# Patient Record
Sex: Female | Born: 1995 | Race: White | Hispanic: No | Smoking: Former smoker
Health system: Southern US, Community
[De-identification: ages and names within clinical notes are randomized; demographics above are authoritative.]

## PROBLEM LIST (undated history)

## (undated) DIAGNOSIS — E039 Hypothyroidism, unspecified: Secondary | ICD-10-CM

## (undated) DIAGNOSIS — F319 Bipolar disorder, unspecified: Secondary | ICD-10-CM

## (undated) DIAGNOSIS — F32A Depression, unspecified: Secondary | ICD-10-CM

## (undated) DIAGNOSIS — F419 Anxiety disorder, unspecified: Secondary | ICD-10-CM

## (undated) HISTORY — DX: Anxiety disorder, unspecified: F41.9

## (undated) HISTORY — DX: Hypothyroidism, unspecified: E03.9

## (undated) HISTORY — DX: Bipolar disorder, unspecified: F31.9

## (undated) HISTORY — PX: INDUCED ABORTION: SHX677

## (undated) HISTORY — DX: Depression, unspecified: F32.A

---

## 2016-08-27 ENCOUNTER — Emergency Department (HOSPITAL_COMMUNITY)
Admission: EM | Admit: 2016-08-27 | Discharge: 2016-08-28 | Disposition: A | Discharge status: Home / Self Care | Payer: BLUE CROSS/BLUE SHIELD | Attending: Emergency Medicine | Admitting: Emergency Medicine

## 2016-08-27 ENCOUNTER — Encounter (HOSPITAL_COMMUNITY): Payer: Self-pay

## 2016-08-27 DIAGNOSIS — Z3A01 Less than 8 weeks gestation of pregnancy: Secondary | ICD-10-CM | POA: Diagnosis not present

## 2016-08-27 DIAGNOSIS — O0281 Inappropriate change in quantitative human chorionic gonadotropin (hCG) in early pregnancy: Secondary | ICD-10-CM | POA: Diagnosis not present

## 2016-08-27 DIAGNOSIS — Z87891 Personal history of nicotine dependence: Secondary | ICD-10-CM | POA: Diagnosis not present

## 2016-08-27 DIAGNOSIS — O210 Mild hyperemesis gravidarum: Secondary | ICD-10-CM | POA: Diagnosis not present

## 2016-08-27 DIAGNOSIS — O219 Vomiting of pregnancy, unspecified: Secondary | ICD-10-CM

## 2016-08-27 LAB — CBC
HCT: 38.2 % (ref 36.0–46.0)
HEMOGLOBIN: 12.3 g/dL (ref 12.0–15.0)
MCH: 26.5 pg (ref 26.0–34.0)
MCHC: 32.2 g/dL (ref 30.0–36.0)
MCV: 82.2 fL (ref 78.0–100.0)
PLATELETS: 347 10*3/uL (ref 150–400)
RBC: 4.65 MIL/uL (ref 3.87–5.11)
RDW: 15.5 % (ref 11.5–15.5)
WBC: 13.7 10*3/uL — ABNORMAL HIGH (ref 4.0–10.5)

## 2016-08-27 LAB — BASIC METABOLIC PANEL
ANION GAP: 9 (ref 5–15)
BUN: 5 mg/dL — ABNORMAL LOW (ref 6–20)
CHLORIDE: 102 mmol/L (ref 101–111)
CO2: 23 mmol/L (ref 22–32)
Calcium: 9.2 mg/dL (ref 8.9–10.3)
Creatinine, Ser: 0.65 mg/dL (ref 0.44–1.00)
GFR calc non Af Amer: >60 mL/min (ref >60)
Glucose, Bld: 89 mg/dL (ref 65–99)
Potassium: 3.6 mmol/L (ref 3.5–5.1)
Sodium: 134 mmol/L — ABNORMAL LOW (ref 135–145)

## 2016-08-27 LAB — HCG, QUANTITATIVE, PREGNANCY: hCG, Beta Chain, Quant, S: 147446 m[IU]/mL — ABNORMAL HIGH (ref <5)

## 2016-08-27 MED ORDER — SODIUM CHLORIDE 0.9 % IV SOLN
INTRAVENOUS | Status: AC
  Administered 2016-08-27: 23:00:00 via INTRAVENOUS

## 2016-08-27 MED ORDER — METOCLOPRAMIDE HCL 5 MG/ML IJ SOLN
10.0000 mg | Freq: Once | INTRAMUSCULAR | Status: AC
  Administered 2016-08-27: 10 mg via INTRAVENOUS
  Filled 2016-08-27: qty 2

## 2016-08-27 NOTE — ED Triage Notes (Signed)
Pt complaining of nausea and vomiting. Pt states recently found out she is pregnant. Pt states LMP end of December. Pt states emesis x 6 today.

## 2016-08-27 NOTE — ED Provider Notes (Signed)
[facility]-EMERGENCY DEPT Provider Note   CSN: [id number] Arrival date & time: 08/27/16  1857   By signing my name below, I, Stacie Schmidt, attest that this documentation has been prepared under the direction and in the presence of [facility], FNP. Electronically Signed: Clarisse Schmidt, Scribe. 08/27/16. 10:15 PM.   History   Chief Complaint Chief Complaint  Patient presents with  . Emesis During Pregnancy   The history is provided by the patient and medical records. No language interpreter was used.    HPI Comments: Stacie Schmidt is a 21 y.o. female who presents to the Emergency Department complaining of profuse nausea and vomiting today. She states she found out she was pregnant [redacted] weeks ago via a home pregnancy test. Pt notes she has vomited 6 times today and she reports associated headache and abdominal cramping during dry heaving spells. She notes she is unable to keep down fluids or food. G2/P1/A0. She states her last pregnancy involved no complications and her child was delivered in [place] of [address]. She reports she plans to return to [place] of [place] for prenatal care during this pregnancy. Last period reportedly in the last week of December. Pt denies fever, chills, cough, congestion, urinary frequency or dysuria.  History reviewed. No pertinent past medical history.  There are no active problems to display for this patient.   History reviewed. No pertinent surgical history.  OB History    Gravida Para Term Preterm AB Living   1             SAB TAB Ectopic Multiple Live Births                   Home Medications    Prior to Admission medications   Medication Sig Start Date End Date Taking? Authorizing Provider  metoCLOPramide (REGLAN) 10 MG tablet Take 1 tablet (10 mg total) by mouth every 8 (eight) hours as needed for nausea. 08/28/16   Hope Orlene Och, NP    Family History History reviewed. No pertinent family history.  Social  History Social History  Substance Use Topics  . Smoking status: Former Games developer  . Smokeless tobacco: Never Used  . Alcohol use Yes     Allergies   Patient has no allergy information on record.   Review of Systems Review of Systems  Constitutional: Negative for chills and fever.  HENT: Negative for congestion.   Respiratory: Negative for cough.   Gastrointestinal: Positive for nausea and vomiting. Negative for diarrhea.  Genitourinary: Negative for dysuria, frequency and urgency.  All other systems reviewed and are negative.    Physical Exam Updated Vital Signs BP 120/68 (BP Location: Right Arm)   Pulse 76   Temp 98.4 F (36.9 C) (Oral)   Resp 20   Ht 5' (1.524 m)   Wt 150 lb (68 kg)   LMP 07/19/2016 (Approximate)   SpO2 100%   BMI 29.29 kg/m   Physical Exam  Constitutional: She is oriented to person, place, and time. Vital signs are normal. She appears well-developed and well-nourished.  Non-toxic appearance. No distress.  HENT:  Head: Normocephalic and atraumatic.  Mouth/Throat: Oropharynx is clear and moist and mucous membranes are normal.  Eyes: EOM are normal.  Neck: Normal range of motion. Neck supple.  Cardiovascular: Normal rate and regular rhythm.   Pulmonary/Chest: Effort normal and breath sounds normal.  Abdominal: Soft. Normal appearance and bowel sounds are normal. There is no tenderness. There is no CVA tenderness.  Musculoskeletal: Normal range of motion.  Neurological: She is alert and oriented to person, place, and time. She has normal strength. No sensory deficit.  Skin: Skin is warm, dry and intact. No rash noted.  Psychiatric: She has a normal mood and affect. Her behavior is normal.  Nursing note and vitals reviewed.    ED Treatments / Results  DIAGNOSTIC STUDIES: Oxygen Saturation is 100% on RA, normal by my interpretation.    COORDINATION OF CARE: 10:08 PM Discussed treatment plan with pt at bedside and pt agreed to plan. Will order IV  fluids and urinalysis. Will order nausea medications.  Labs (all labs ordered are listed, but only abnormal results are displayed) Labs Reviewed  URINALYSIS, ROUTINE W REFLEX MICROSCOPIC - Abnormal; Notable for the following:       Result Value   Color, Urine STRAW (*)    Ketones, ur 20 (*)    All other components within normal limits  CBC - Abnormal; Notable for the following:    WBC 13.7 (*)    All other components within normal limits  HCG, QUANTITATIVE, PREGNANCY - Abnormal; Notable for the following:    hCG, Beta Chain, Quant, S [id number] (*)    All other components within normal limits  BASIC METABOLIC PANEL - Abnormal; Notable for the following:    Sodium 134 (*)    BUN 5 (*)    All other components within normal limits     Radiology [place] Ob Comp Less 14 Wks  Result Date: 08/28/2016 CLINICAL DATA:  Hyperemesis. Estimated gestational age by LMP is 7 weeks 0 days. Quantitative beta HCG is 147,446 EXAM: OBSTETRIC <14 WK US AND TRANSVAGINAL OB US TECHNIQUE: Both transabdominal and transvaginal ultrasound examinations were performed for complete evaluation of the gestation as well as the maternal uterus, adnexal regions, and pelvic cul-de-sac. Transvaginal technique was performed to assess early pregnancy. COMPARISON:  None. FINDINGS: Intrauterine gestational sac: A single intrauterine gestational sac is present. Yolk sac:  Yolk sac is identified. Embryo:  Fetal pole is present. Cardiac Activity: Fetal cardiac activity is observed. Heart Rate: 119  bpm CRL:  8.7  mm   6 w   6 d                  US EDC: 04/17/2017 Subchorionic hemorrhage:  None visualized. Maternal uterus/adnexae: Uterus is anteverted. No myometrial mass lesions identified. Both ovaries are visualized and appear normal. No abnormal adnexal masses. No free fluid in the pelvis. IMPRESSION: Single intrauterine pregnancy. Estimated gestational age by LMP is 6 weeks 6 days. No acute complication is identified sonographically.  Electronically Signed   By: Burman NievesWilliam  Stevens M.D.   On: 08/28/2016 01:53   [place] Ob Transvaginal  Result Date: 08/28/2016 CLINICAL DATA:  Hyperemesis. Estimated gestational age by LMP is 7 weeks 0 days. Quantitative beta HCG is 147,446 EXAM: OBSTETRIC <14 WK US AND TRANSVAGINAL OB US TECHNIQUE: Both transabdominal and transvaginal ultrasound examinations were performed for complete evaluation of the gestation as well as the maternal uterus, adnexal regions, and pelvic cul-de-sac. Transvaginal technique was performed to assess early pregnancy. COMPARISON:  None. FINDINGS: Intrauterine gestational sac: A single intrauterine gestational sac is present. Yolk sac:  Yolk sac is identified. Embryo:  Fetal pole is present. Cardiac Activity: Fetal cardiac activity is observed. Heart Rate: 119  bpm CRL:  8.7  mm   6 w   6 d  [place] EDC: 04/17/2017 Subchorionic hemorrhage:  None visualized. Maternal uterus/adnexae: Uterus is anteverted. No myometrial mass lesions identified. Both ovaries are visualized and appear normal. No abnormal adnexal masses. No free fluid in the pelvis. IMPRESSION: Single intrauterine pregnancy. Estimated gestational age by LMP is 6 weeks 6 days. No acute complication is identified sonographically. Electronically Signed   By: Burman Nieves M.D.   On: 08/28/2016 01:53    Procedures Procedures (including critical care time)  Medications Ordered in ED Medications  0.9 %  sodium chloride infusion ( Intravenous Stopped 08/28/16 0023)  metoCLOPramide (REGLAN) injection 10 mg (10 mg Intravenous Given 08/27/16 2256)     Initial Impression / Assessment and Plan / ED Course  I have reviewed the triage vital signs and the nursing notes.  Pertinent labs & imaging results that were available during my care of the patient were reviewed by me and considered in my medical decision making (see chart for details).    I personally performed the services described in this documentation, which  was scribed in my presence. The recorded information has been reviewed and is accurate.   Final Clinical Impressions(s) / ED Diagnoses  21 y.o. female with n/v in early pregnancy stable for d/c feeling much better after IV hydration and Reglan. Taking PO fluids without difficulty. Ultrasound she viable IUP. Will Rx Reglan and she will f/u with her OB to start prenatal care. Return precautions given.  Final diagnoses:  Nausea and vomiting during pregnancy prior to [redacted] weeks gestation    New Prescriptions New Prescriptions   METOCLOPRAMIDE (REGLAN) 10 MG TABLET    Take 1 tablet (10 mg total) by mouth every 8 (eight) hours as needed for nausea.     [address] [place], NP 08/28/16 0210    Shaune Pollack, MD 08/28/16 [id number]

## 2016-08-28 ENCOUNTER — Emergency Department (HOSPITAL_COMMUNITY): Payer: BLUE CROSS/BLUE SHIELD

## 2016-08-28 LAB — URINALYSIS, ROUTINE W REFLEX MICROSCOPIC
Bilirubin Urine: NEGATIVE
Glucose, UA: NEGATIVE mg/dL
Hgb urine dipstick: NEGATIVE
Ketones, ur: 20 mg/dL — AB
LEUKOCYTES UA: NEGATIVE
NITRITE: NEGATIVE
Protein, ur: NEGATIVE mg/dL
SPECIFIC GRAVITY, URINE: 1.005 (ref 1.005–1.030)
pH: 6 (ref 5.0–8.0)

## 2016-08-28 MED ORDER — METOCLOPRAMIDE HCL 10 MG PO TABS: 10.0000 mg | ORAL_TABLET | Freq: Three times a day (TID) | ORAL | 0 refills | Status: DC | PRN

## 2016-08-28 NOTE — ED Notes (Signed)
Patient transported to Ultrasound 

## 2016-08-28 NOTE — Discharge Instructions (Signed)
Your ultrasound tonight shows that you are 6 weeks and 6 days pregnant. Start your prenatal care and take the medication for nausea as needed.

## 2016-08-28 NOTE — ED Notes (Signed)
Pt returned from ultrasound

## 2017-03-04 IMAGING — US US OB TRANSVAGINAL
1 series · 14 of 28 positions shown · non-contrast
Comparison: None.

CLINICAL DATA: Hyperemesis. Estimated gestational age by LMP is 7
weeks 0 days. Quantitative beta HCG is 147,446

EXAM:
OBSTETRIC <14 WK US AND TRANSVAGINAL OB US
TECHNIQUE: Both transabdominal and transvaginal ultrasound examinations were
performed for complete evaluation of the gestation as well as the
maternal uterus, adnexal regions, and pelvic cul-de-sac.
Transvaginal technique was performed to assess early pregnancy.

[Series 1: us ob transvaginal · 0.20mm/px · 46 acquisitions, 14 frames shown]
[im 2/46]
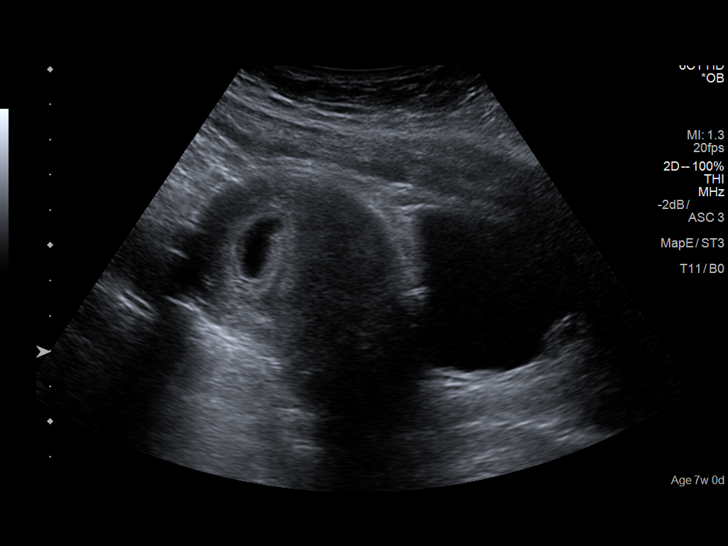
[im 6/46]
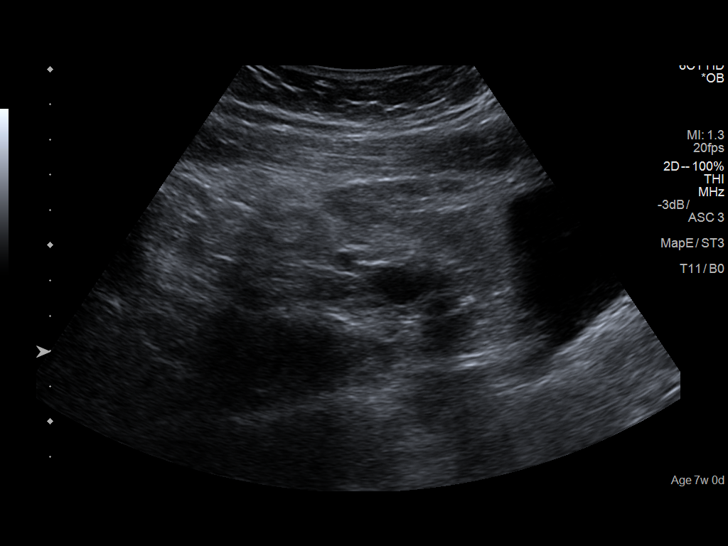
[im 9/46]
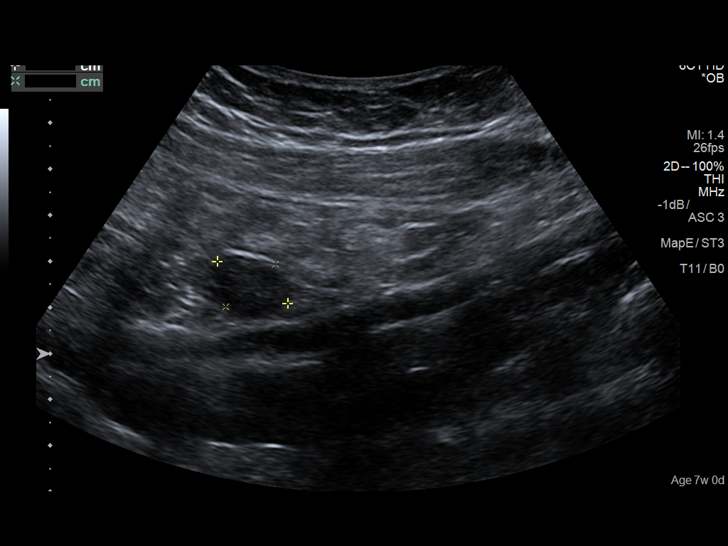
[im 12/46]
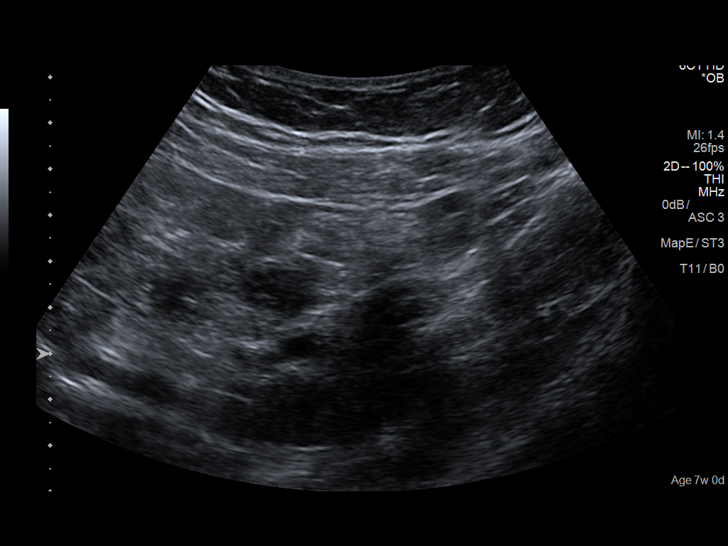
[im 16/46]
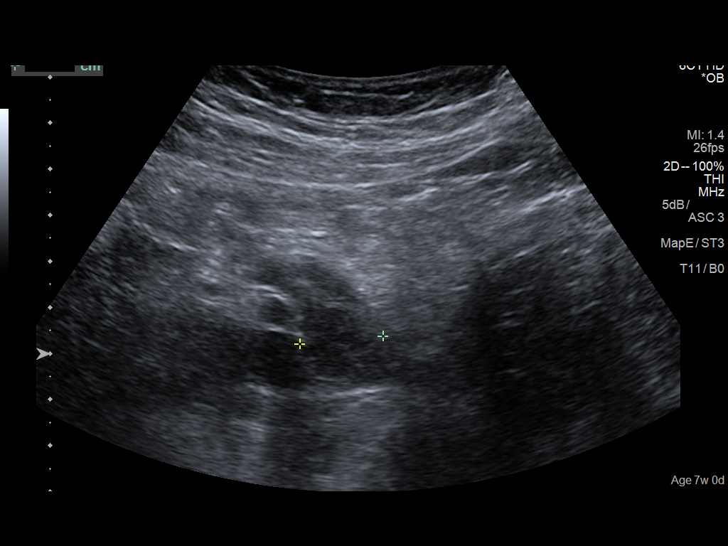
[im 19/46]
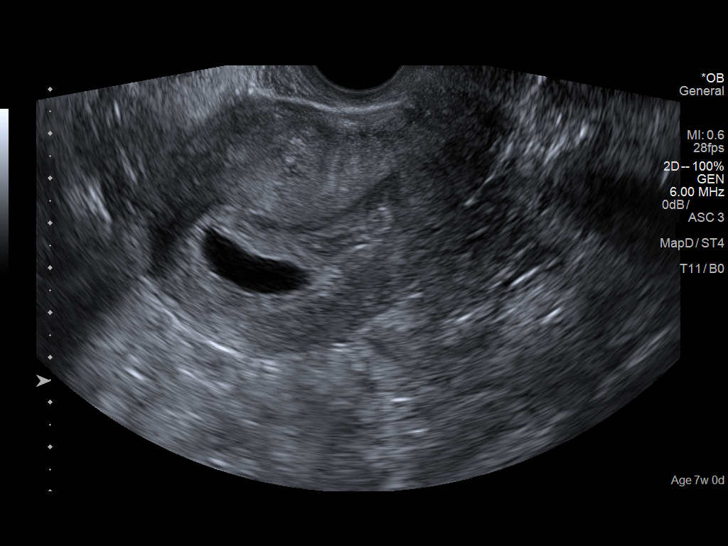
[im 22/46]
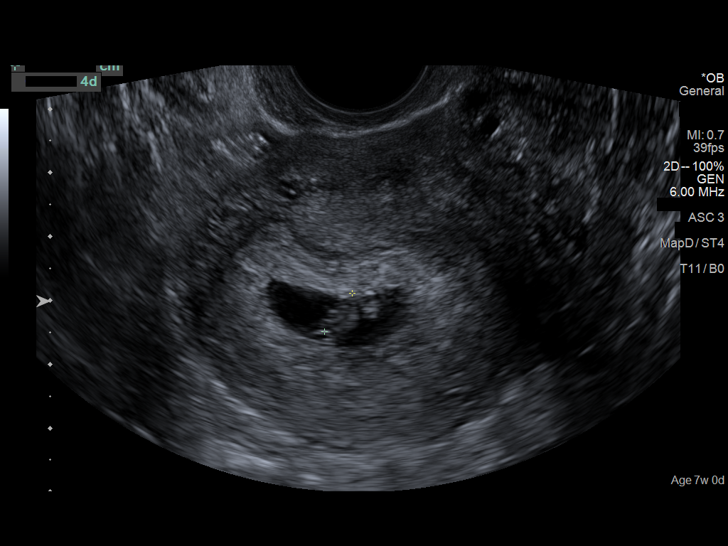
[im 26/46]
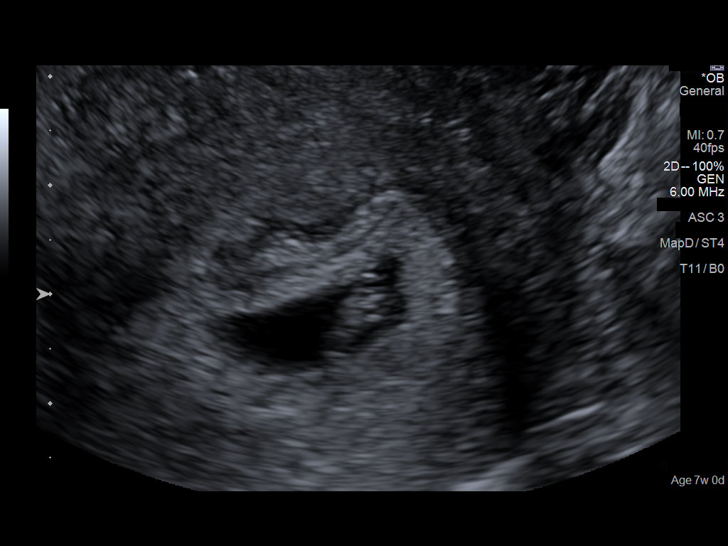
[im 29/46]
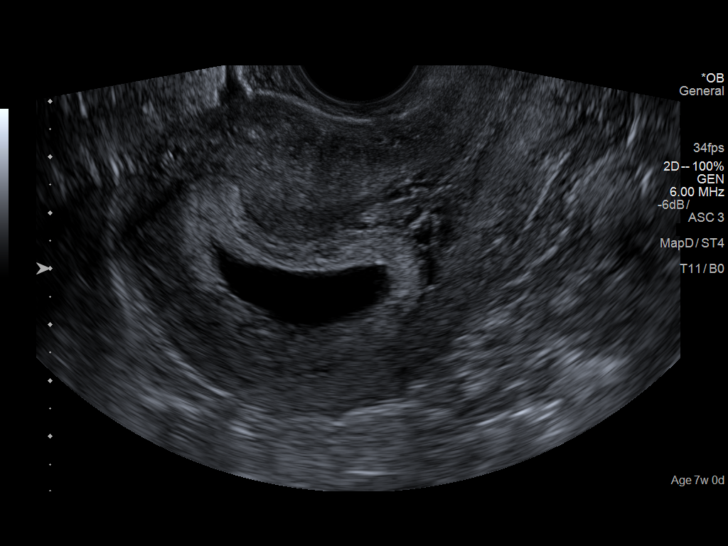
[im 32/46]
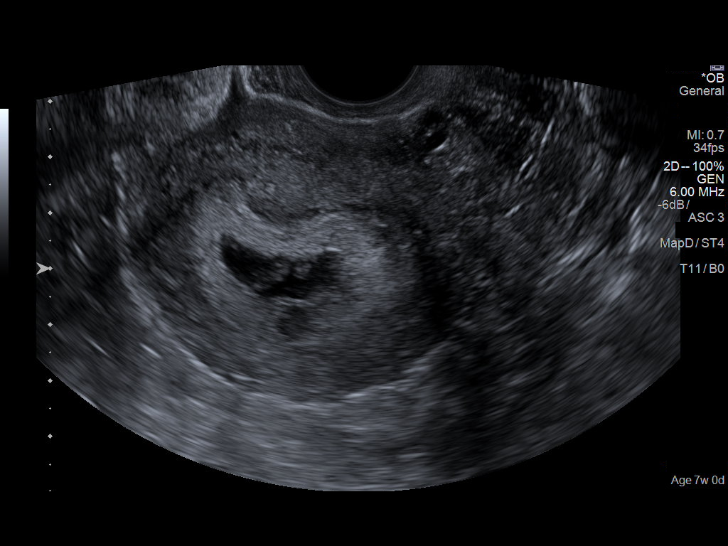
[im 36/46]
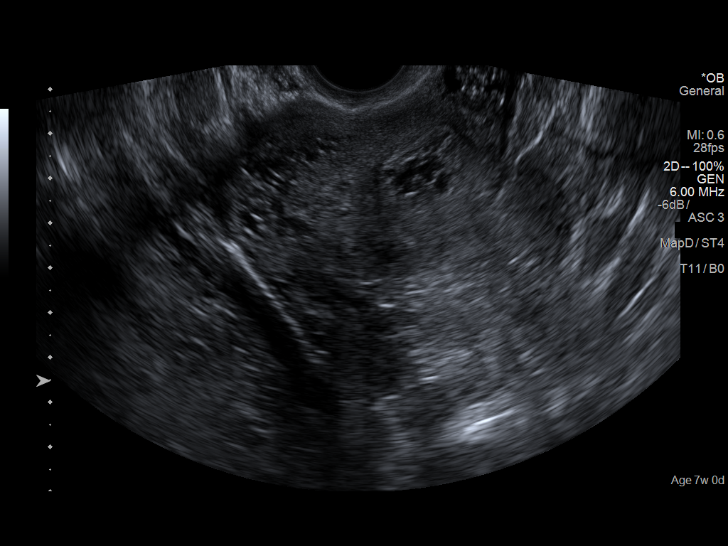
[im 39/46]
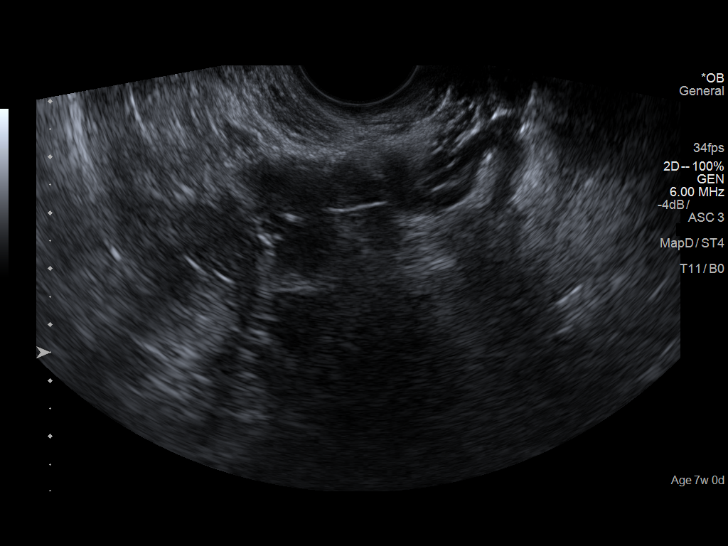
[im 42/46]
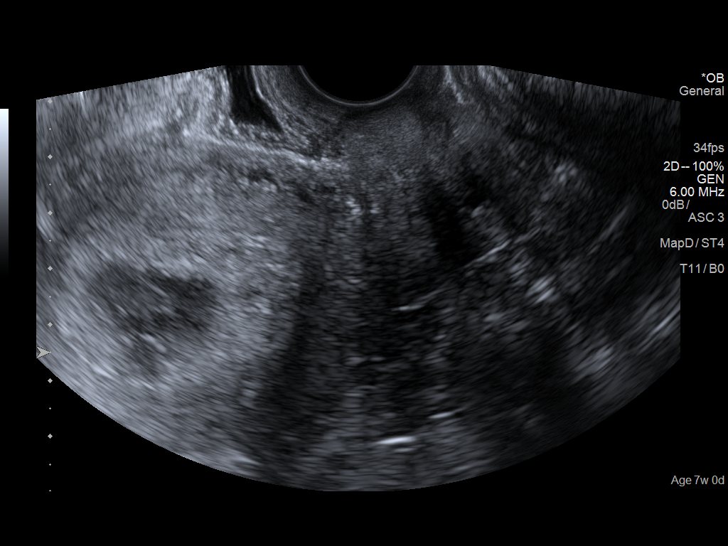
[im 46/46]
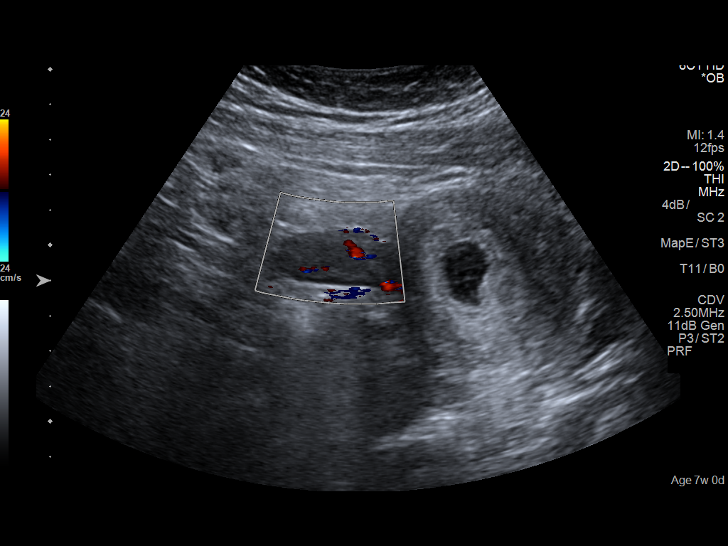

[14 of 28 positions shown; findings below may reference images not displayed]

FINDINGS: Intrauterine gestational sac: A single intrauterine gestational sac
is present.

Yolk sac:  Yolk sac is identified.

Embryo:  Fetal pole is present.

Cardiac Activity: Fetal cardiac activity is observed.

Heart Rate: 119  bpm

CRL:  8.7  mm   6 w   6 d                  US EDC: 04/17/2017

Subchorionic hemorrhage:  None visualized.

Maternal uterus/adnexae: Uterus is anteverted. No myometrial mass
lesions identified. Both ovaries are visualized and appear normal.
No abnormal adnexal masses. No free fluid in the pelvis.
IMPRESSION: Single intrauterine pregnancy. Estimated gestational age by LMP is 6
weeks 6 days. No acute complication is identified sonographically.

## 2018-01-04 ENCOUNTER — Telehealth: Payer: Self-pay | Admitting: Obstetrics and Gynecology

## 2018-01-04 NOTE — Telephone Encounter (Signed)
Called and left a message for patient to call back to schedule a new patient doctor referral appointment with our office to see Dr. Jertson. °

## 2022-04-18 NOTE — Progress Notes (Unsigned)
[facility] Initial Visit:  Patient Care Team: Patient, No Pcp Per as PCP - General (General Practice)  CHIEF COMPLAINTS/PURPOSE OF CONSULTATION:  Oncology History   No history exists.    HISTORY OF PRESENTING ILLNESS: Stacie Schmidt 26 y.o. 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  Patient has chronic fatigue and snores a lot in her sleep  Dec 12, 2021: WBC 10.0 hemoglobin 11.7 platelet count 401; 46 segs 44 lymphs 7 monos 2 eos 1 basophil  April 08, 2022 WBC 13.6 hemoglobin 11.5 MCV 79 platelet count 464; 47 segs 43 lymphs 8 monos 1 EO 1 basophil.  Absolute neutrophil count 6.4 absolute lymphocyte count 5.9.  Reticulocyte count 0.83% Vitamin B12 756 folate 8.1 ferritin 2.5 CMP normal  hemoglobin A1c 5.5 TSH 4.49  April 20 2022:  [facility] Hematology Consult  Feels well. Had a bout of laryngitis a few months ago treated with Abx.  No recurrent sinopulmonary infections.    Social:  Educational psychologist.  Divorced.  Vapes daily which she began after quitting smoking 2 yrs ago.  Began smoking cigarettes age 20 and quit at 2 and smoked 2 ppd.  EtOH once every 2 months  [facility] Mother alive 42 HTN and thyroid Father died 41 suicide 70 brother alive 8 well   Review of Systems  Constitutional:  Negative for appetite change, chills, fatigue, fever and unexpected weight change.  HENT:   Negative for mouth sores, nosebleeds, sore throat, trouble swallowing and voice change.   Eyes:  Negative for eye problems and icterus.       Vision changes:  None  Respiratory:  Negative for chest tightness, cough, hemoptysis, shortness of breath and wheezing.        PND:  none Orthopnea:  none DOE:    Cardiovascular:  Negative for chest pain, leg swelling and palpitations.       PND:  none Orthopnea:  none  Gastrointestinal:  Negative for abdominal pain, blood in stool,  constipation, diarrhea and nausea.  Endocrine:       Cold intolerance:  none Heat intolerance:  none  Genitourinary:  Negative for bladder incontinence, difficulty urinating, dysuria, frequency, hematuria and nocturia.   Musculoskeletal:  Negative for arthralgias, back pain, gait problem, myalgias, neck pain and neck stiffness.  Skin:  Negative for itching, rash and wound.  Neurological:  Positive for headaches. Negative for dizziness, extremity weakness, gait problem, light-headedness, numbness, seizures and speech difficulty.       Has HA's quite often  Hematological:  Negative for adenopathy. Bruises/bleeds easily.    MEDICAL HISTORY: Past Medical History:  Diagnosis Date   Anxiety    Bipolar disorder ([place])    Depression    Hypothyroidism     SURGICAL HISTORY: Past Surgical History:  Procedure Laterality Date   INDUCED ABORTION      SOCIAL HISTORY: Social History   Socioeconomic History   Marital status: Married    Spouse name: Not on file   Number of children: Not on file   Years of education: Not on file   Highest education level: Not on file  Occupational History   Not on file  Tobacco Use   Smoking status: Former   Smokeless tobacco: Never  Vaping Use   Vaping Use: Every day  Substance and Sexual Activity   Alcohol use: Yes   Drug use: Yes    Types: Marijuana   Sexual activity: Not on  file  Other Topics Concern   Not on file  Social History Narrative   Not on file   Social Determinants of Health   Financial Resource Strain: Not on file  Food Insecurity: Not on file  Transportation Needs: Not on file  Physical Activity: Not on file  Stress: Not on file  Social Connections: Not on file  Intimate Partner Violence: Not on file    FAMILY HISTORY Family History  Problem Relation Age of Onset   Thyroid disease Mother    Hypertension Mother     ALLERGIES:  has no allergies on file.  MEDICATIONS:  Current Outpatient Medications  Medication Sig  Dispense Refill   buPROPion (ZYBAN) 150 MG 12 hr tablet Take 150 mg by mouth in the morning.     busPIRone (BUSPAR) 10 MG tablet Take by mouth.     escitalopram (LEXAPRO) 5 MG tablet Take 1 tablet by mouth daily.     levothyroxine (SYNTHROID) 112 MCG tablet TAKE 1 TABLET BY MOUTH ONCE DAILY IN THE MORNING BEFORE BREAKFAST     lurasidone (LATUDA) 40 MG TABS tablet TAKE 1 TABLET BY MOUTH ONCE DAILY WITH MINIMUM 350KCAL FOOD     paragard intrauterine copper IUD IUD by Intrauterine route.     No current facility-administered medications for this visit.    PHYSICAL EXAMINATION:  ECOG PERFORMANCE STATUS: {CHL ONC ECOG PS:[phone]}   There were no vitals filed for this visit.  There were no vitals filed for this visit.   Physical Exam   LABORATORY DATA: I have personally reviewed the data as listed:  No visits with results within 1 Month(s) from this visit.  Latest known visit with results is:  Admission on 08/27/2016, Discharged on 08/28/2016  Component Date Value Ref Range Status   Color, Urine 08/28/2016 STRAW (A)  YELLOW Final   APPearance 08/28/2016 CLEAR  CLEAR Final   Specific Gravity, Urine 08/28/2016 1.005  1.005 - 1.030 Final   pH 08/28/2016 6.0  5.0 - 8.0 Final   Glucose, UA 08/28/2016 NEGATIVE  NEGATIVE mg/dL Final   Hgb urine dipstick 08/28/2016 NEGATIVE  NEGATIVE Final   Bilirubin Urine 08/28/2016 NEGATIVE  NEGATIVE Final   Ketones, ur 08/28/2016 20 (A)  NEGATIVE mg/dL Final   Protein, ur 08/28/2016 NEGATIVE  NEGATIVE mg/dL Final   Nitrite 08/28/2016 NEGATIVE  NEGATIVE Final   Leukocytes, UA 08/28/2016 NEGATIVE  NEGATIVE Final   WBC 08/27/2016 13.7 (H)  4.0 - 10.5 K/uL Final   RBC 08/27/2016 4.65  3.87 - 5.11 MIL/uL Final   Hemoglobin 08/27/2016 12.3  12.0 - 15.0 g/dL Final   HCT 08/27/2016 38.2  36.0 - 46.0 % Final   MCV 08/27/2016 82.2  78.0 - 100.0 fL Final   MCH 08/27/2016 26.5  26.0 - 34.0 pg Final   MCHC 08/27/2016 32.2  30.0 - 36.0 g/dL Final   RDW  08/27/2016 15.5  11.5 - 15.5 % Final   Platelets 08/27/2016 347  150 - 400 K/uL Final   hCG, Beta Chain, Quant, S 08/27/2016 147,446 (H)  <5 mIU/mL Final   Comment:          GEST. AGE      CONC.  (mIU/mL)   <=1 WEEK        5 - 50     2 WEEKS       50 - 500     3 WEEKS       100 - 10,000     4 WEEKS  1,000 - 30,000     5 WEEKS     3,500 - 115,000   6-8 WEEKS     12,000 - 270,000    12 WEEKS     15,000 - 220,000        FEMALE AND NON-PREGNANT FEMALE:     LESS THAN 5 mIU/mL    Sodium 08/27/2016 134 (L)  135 - 145 mmol/L Final   Potassium 08/27/2016 3.6  3.5 - 5.1 mmol/L Final   Chloride 08/27/2016 102  101 - 111 mmol/L Final   CO2 08/27/2016 23  22 - 32 mmol/L Final   Glucose, Bld 08/27/2016 89  65 - 99 mg/dL Final   BUN 08/27/2016 5 (L)  6 - 20 mg/dL Final   Creatinine, Ser 08/27/2016 0.65  0.44 - 1.00 mg/dL Final   Calcium 08/27/2016 9.2  8.9 - 10.3 mg/dL Final   GFR calc non Af Amer 08/27/2016 >60  >60 mL/min Final   GFR calc Af Amer 08/27/2016 >60  >60 mL/min Final   Comment: (NOTE) The eGFR has been calculated using the CKD EPI equation. This calculation has not been validated in all clinical situations. eGFR's persistently <60 mL/min signify possible Chronic Kidney Disease.    Anion gap 08/27/2016 9  5 - 15 Final    RADIOGRAPHIC STUDIES: I have personally reviewed the radiological images as listed and agree with the findings in the report  No results found.  ASSESSMENT/PLAN Cancer Staging  No matching staging information was found for the patient.   No problem-specific Assessment & Plan notes found for this encounter.   No orders of the defined types were placed in this encounter.   All questions were answered. The patient knows to call the clinic with any problems, questions or concerns.  This note was electronically signed.    Barbee Cough, MD  04/18/2022 3:31 PM

## 2022-04-20 ENCOUNTER — Encounter: Payer: Self-pay | Admitting: Oncology

## 2022-04-20 ENCOUNTER — Inpatient Hospital Stay: Payer: Medicaid Other

## 2022-04-20 ENCOUNTER — Inpatient Hospital Stay: Payer: Medicaid Other | Attending: Oncology | Admitting: Oncology

## 2022-04-20 DIAGNOSIS — E039 Hypothyroidism, unspecified: Secondary | ICD-10-CM | POA: Insufficient documentation

## 2022-04-20 DIAGNOSIS — R5382 Chronic fatigue, unspecified: Secondary | ICD-10-CM | POA: Diagnosis not present

## 2022-04-20 DIAGNOSIS — D72828 Other elevated white blood cell count: Secondary | ICD-10-CM | POA: Diagnosis not present

## 2022-04-20 DIAGNOSIS — U070 Vaping-related disorder: Secondary | ICD-10-CM | POA: Diagnosis not present

## 2022-04-20 DIAGNOSIS — R0683 Snoring: Secondary | ICD-10-CM | POA: Diagnosis not present

## 2022-04-20 DIAGNOSIS — D72829 Elevated white blood cell count, unspecified: Secondary | ICD-10-CM | POA: Diagnosis present

## 2022-04-20 LAB — CBC WITH DIFFERENTIAL (CANCER CENTER ONLY)
Abs Immature Granulocytes: 0.04 10*3/uL (ref 0.00–0.07)
Basophils Absolute: 0.1 10*3/uL (ref 0.0–0.1)
Basophils Relative: 1 %
Eosinophils Absolute: 0.2 10*3/uL (ref 0.0–0.5)
Eosinophils Relative: 2 %
HCT: 36.9 % (ref 36.0–46.0)
Hemoglobin: 11.3 g/dL — ABNORMAL LOW (ref 12.0–15.0)
Immature Granulocytes: 0 %
Lymphocytes Relative: 39 %
Lymphs Abs: 4.7 10*3/uL — ABNORMAL HIGH (ref 0.7–4.0)
MCH: 25.9 pg — ABNORMAL LOW (ref 26.0–34.0)
MCHC: 30.6 g/dL (ref 30.0–36.0)
MCV: 84.4 fL (ref 80.0–100.0)
Monocytes Absolute: 0.7 10*3/uL (ref 0.1–1.0)
Monocytes Relative: 6 %
Neutro Abs: 6.3 10*3/uL (ref 1.7–7.7)
Neutrophils Relative %: 52 %
Platelet Count: 430 10*3/uL — ABNORMAL HIGH (ref 150–400)
RBC: 4.37 MIL/uL (ref 3.87–5.11)
RDW: 18 % — ABNORMAL HIGH (ref 11.5–15.5)
WBC Count: 12 10*3/uL — ABNORMAL HIGH (ref 4.0–10.5)
nRBC: 0 % (ref 0.0–0.2)

## 2022-04-21 ENCOUNTER — Other Ambulatory Visit: Payer: Self-pay

## 2022-04-21 DIAGNOSIS — D72828 Other elevated white blood cell count: Secondary | ICD-10-CM

## 2022-04-21 DIAGNOSIS — D72829 Elevated white blood cell count, unspecified: Secondary | ICD-10-CM | POA: Diagnosis not present

## 2022-04-24 LAB — MISC LABCORP TEST (SEND OUT)
LabCorp test name: 511520
Labcorp test code: 511520

## 2022-04-30 LAB — MISC LABCORP TEST (SEND OUT): Labcorp test code: 489615

## 2022-05-15 NOTE — Progress Notes (Unsigned)
[facility] Cancer Follow up Visit:  Patient Care Team: Patient, No Pcp Per as PCP - General (General Practice)  CHIEF COMPLAINTS/PURPOSE OF CONSULTATION:  Oncology History   No history exists.    HISTORY OF PRESENTING ILLNESS: Stacie Schmidt 26 y.o. 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  Patient has chronic fatigue and snores a lot in her sleep  Dec 12, 2021: WBC 10.0 hemoglobin 11.7 platelet count 401; 46 segs 44 lymphs 7 monos 2 eos 1 basophil  April 08, 2022 WBC 13.6 hemoglobin 11.5 MCV 79 platelet count 464; 47 segs 43 lymphs 8 monos 1 EO 1 basophil.  Absolute neutrophil count 6.4 absolute lymphocyte count 5.9.  Reticulocyte count 0.83% Vitamin B12 756 folate 8.1 ferritin 2.5 CMP normal  hemoglobin A1c 5.5 TSH 4.49  April 20 2022:  [facility] Hematology Consult  Feels well. Had a bout of laryngitis a few months ago treated with Abx.  No recurrent sinopulmonary infections.    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  Vapes daily which she began after quitting smoking 2 yrs ago.  Began smoking cigarettes age 4 and quit at 1 and smoked 2 ppd.  EtOH once every 2 months  [facility] Mother alive 45 HTN and thyroid Father died 46 suicide Half brother alive 50 well   WBC 12.0 hemoglobin 11.3 platelet count 430; 52 segs 39 lymphs 6 monos 2 eos 1 basophil JAK2 NGS panel negative FISH for BCR able normal  May 18 2022:  Scheduled follow up regarding granulocytosis Reviewed results of labs with patient.  She is still vaping but has cut down a bit At next visit discuss her use of oral iron   Review of Systems  Constitutional:  Negative for appetite change, chills, fatigue, fever and unexpected weight change.  HENT:   Negative for mouth sores, nosebleeds, sore throat, trouble swallowing and voice change.   Eyes:  Negative for eye problems and icterus.       Vision  changes:  None  Respiratory:  Negative for chest tightness, cough, hemoptysis, shortness of breath and wheezing.        PND:  none Orthopnea:  none DOE:    Cardiovascular:  Negative for chest pain, leg swelling and palpitations.       PND:  none Orthopnea:  none  Gastrointestinal:  Negative for abdominal pain, blood in stool, constipation, diarrhea and nausea.  Endocrine:       Cold intolerance:  none Heat intolerance:  none  Genitourinary:  Negative for bladder incontinence, difficulty urinating, dysuria, frequency, hematuria and nocturia.   Musculoskeletal:  Negative for arthralgias, back pain, gait problem, myalgias, neck pain and neck stiffness.  Skin:  Negative for itching, rash and wound.  Neurological:  Positive for headaches. Negative for dizziness, extremity weakness, gait problem, light-headedness, numbness, seizures and speech difficulty.       Has HA's quite often  Hematological:  Negative for adenopathy. Bruises/bleeds easily.    MEDICAL HISTORY: Past Medical History:  Diagnosis Date   Anxiety    Bipolar disorder (HCC)    Depression    Hypothyroidism     SURGICAL HISTORY: Past Surgical History:  Procedure Laterality Date   INDUCED ABORTION      SOCIAL HISTORY: Social History   Socioeconomic History   Marital status: Divorced    Spouse name: Not on file   Number of children: 2   Years of education: 9   Highest  education level: 9th grade  Occupational History   Not on file  Tobacco Use   Smoking status: Former   Smokeless tobacco: Never  Vaping Use   Vaping Use: Every day  Substance and Sexual Activity   Alcohol use: Yes   Drug use: Yes    Types: Marijuana   Sexual activity: Not on file  Other Topics Concern   Not on file  Social History Narrative   Not on file   Social Determinants of Health   Financial Resource Strain: Not on file  Food Insecurity: Not on file  Transportation Needs: Not on file  Physical Activity: Not on file  Stress:  Not on file  Social Connections: Not on file  Intimate Partner Violence: Not on file    FAMILY HISTORY Family History  Problem Relation Age of Onset   Thyroid disease Mother    Hypertension Mother    Breast cancer Other    Liver cancer Other     ALLERGIES:  has No Known Allergies.  MEDICATIONS:  Current Outpatient Medications  Medication Sig Dispense Refill   buPROPion (ZYBAN) 150 MG 12 hr tablet Take 150 mg by mouth in the morning.     busPIRone (BUSPAR) 10 MG tablet Take by mouth.     escitalopram (LEXAPRO) 5 MG tablet Take 1 tablet by mouth daily.     ferrous sulfate 325 (65 FE) MG tablet Take 325 mg by mouth 2 (two) times daily with a meal.     levothyroxine (SYNTHROID) 112 MCG tablet TAKE 1 TABLET BY MOUTH ONCE DAILY IN THE MORNING BEFORE BREAKFAST     lurasidone (LATUDA) 40 MG TABS tablet TAKE 1 TABLET BY MOUTH ONCE DAILY WITH MINIMUM 350KCAL FOOD     paragard intrauterine copper IUD IUD by Intrauterine route.     trimethoprim-polymyxin b (POLYTRIM) ophthalmic solution 1 drop 4 (four) times daily.     No current facility-administered medications for this visit.    PHYSICAL EXAMINATION:  ECOG PERFORMANCE STATUS: 0 - Asymptomatic   Vitals:   05/18/22 1500  BP: 104/71  Pulse: 81  Resp: 14  Temp: (!) 97.4 F (36.3 C)  SpO2: 99%    Filed Weights   05/18/22 1500  Weight: 154 lb 14.4 oz (70.3 kg)     Physical Exam Vitals and nursing note reviewed.  Constitutional:      Appearance: Normal appearance. She is normal weight. She is not toxic-appearing or diaphoretic.  HENT:     Head: Normocephalic and atraumatic.     Right Ear: External ear normal.     Left Ear: External ear normal.     Nose: Nose normal. No congestion or rhinorrhea.  Eyes:     General: No scleral icterus.    Extraocular Movements: Extraocular movements intact.     Conjunctiva/sclera: Conjunctivae normal.     Pupils: Pupils are equal, round, and reactive to light.  Cardiovascular:      Rate and Rhythm: Normal rate.     Heart sounds: No murmur heard.    No friction rub. No gallop.  Pulmonary:     Effort: Pulmonary effort is normal. No respiratory distress.     Breath sounds: Normal breath sounds. No stridor. No wheezing, rhonchi or rales.  Chest:     Chest wall: No tenderness.  Abdominal:     General: Bowel sounds are normal. There is no distension.     Palpations: Abdomen is soft. There is no mass.     Tenderness: There is no  abdominal tenderness. There is no guarding or rebound.     Hernia: No hernia is present.  Musculoskeletal:        General: No swelling, tenderness or deformity.     Cervical back: Normal range of motion and neck supple. No rigidity or tenderness.  Lymphadenopathy:     Head:     Right side of head: No submental, submandibular, tonsillar, preauricular, posterior auricular or occipital adenopathy.     Left side of head: No submental, submandibular, tonsillar, preauricular, posterior auricular or occipital adenopathy.     Cervical: No cervical adenopathy.     Right cervical: No superficial, deep or posterior cervical adenopathy.    Left cervical: No superficial, deep or posterior cervical adenopathy.     Upper Body:     Right upper body: No supraclavicular, axillary, pectoral or epitrochlear adenopathy.     Left upper body: No supraclavicular, axillary, pectoral or epitrochlear adenopathy.  Skin:    General: Skin is warm.     Coloration: Skin is not jaundiced.     Findings: No erythema, lesion or rash.  Neurological:     General: No focal deficit present.     Mental Status: She is alert and oriented to person, place, and time. Mental status is at baseline.     Cranial Nerves: No cranial nerve deficit.  Psychiatric:        Mood and Affect: Mood normal.        Behavior: Behavior normal.        Thought Content: Thought content normal.        Judgment: Judgment normal.      LABORATORY DATA: I have personally reviewed the data as  listed:  Orders Only on 04/21/2022  Component Date Value Ref Range Status   [facility] test code 04/21/2022 [id number]   Final   [facility] test name 04/21/2022 [id number]   Final   Source ([facility]) 04/21/2022 BLOOD   Final   Performed at [facility], [address] [place], [place] [address]   Misc [facility] result 04/21/2022 COMMENT   Final   Comment: (NOTE) Test Ordered: [id number] Oncology BCR/ABL1 FISH Specimen Type                  BLOOD                     YU   Cells Counted                  200                       YU   Cells Analyzed                 200                       YU   FISH Result                    Comment:                  YU   NORMAL:  NO BCR OR ABL1 GENE REARRANGEMENT OBSERVED Interpretation                 Comment:                  YU  nuc ish 9q34(ASS1,ABL1)x2,22q11.2(BCRx2)[200].      The fluorescence in situ hybridization (FISH) study was normal. FISH, using unique sequence DNA probes for the ABL1 and BCR gene regions showed two ABL1 signals (red), two control ASS1 gene signals (aqua) located adjacent to the ABL1 locus at 9q34, and two BCR signals (green) at 22q11.2 in all interphase nuclei examined. There was NO evidence of CML or ALL-associated BCR::ABL1 dual fusion signals in this analysis. .      This analysis is limited to abnormalities detectable by the specific probes incl                          uded in the study. FISH results should be interpreted within the context of a full cytogenetic analysis and pathology evaluation.  A BCR::ABL1 gene fusion in greater than 3 interphase nuclei in a patient with a new clinical diagnosis is considered positive. The DNA probe vendor for this study was [employer], community). .      This test was developed and its performance characteristics determined by [facility] of [address] [employer]). It has not been cleared or approved by the U.S. Food and Drug  Administration. Director Review:               Comment:                  YU   Zacarias Pontes, PHD, [facility] Performed At: [facility] [address] [place], [place] [id number] Jolene Schimke MD TO:[id number] Performed At: [facility] [address] [place], [place] [id number] Maurine Simmering MDPhD ZJ:[id number]    [facility] test code 04/21/2022 [id number]   Corrected   CORRECTED ON 10/09 AT 1056: PREVIOUSLY REPORTED AS [id number]   [facility] test name 04/21/2022 JAK2 [id number]   Corrected   CORRECTED ON 10/09 AT 1056: PREVIOUSLY REPORTED AS [id number]   Misc [facility] result 04/21/2022 COMMENT   Final   Comment: See Scanned report in [facility] (NOTE) See Brother-1 Performed At: [facility] [address] [place], [place] [id number] Jolene Schimke MD GX:[id number] Performed at [facility], [address] [place], [place] [address]   Appointment on 04/20/2022  Component Date Value Ref Range Status   WBC Count 04/20/2022 12.0 (H)  4.0 - 10.5 K/uL Final   RBC 04/20/2022 4.37  3.87 - 5.11 MIL/uL Final   Hemoglobin 04/20/2022 11.3 (L)  12.0 - 15.0 g/dL Final   HCT 85/63/1497 36.9  36.0 - 46.0 % Final   MCV 04/20/2022 84.4  80.0 - 100.0 fL Final   MCH 04/20/2022 25.9 (L)  26.0 - 34.0 pg Final   MCHC 04/20/2022 30.6  30.0 - 36.0 g/dL Final   RDW 02/63/7858 18.0 (H)  11.5 - 15.5 % Final   Platelet Count 04/20/2022 430 (H)  150 - 400 K/uL Final   nRBC 04/20/2022 0.0  0.0 - 0.2 % Final   Neutrophils Relative % 04/20/2022 52  % Final   Neutro Abs 04/20/2022 6.3  1.7 - 7.7 K/uL Final   Lymphocytes Relative 04/20/2022 39  % Final   Lymphs Abs 04/20/2022 4.7 (H)  0.7 - 4.0 K/uL Final   Monocytes Relative 04/20/2022 6  % Final   Monocytes Absolute 04/20/2022 0.7  0.1 - 1.0 K/uL Final   Eosinophils Relative 04/20/2022 2  % Final   Eosinophils Absolute 04/20/2022 0.2  0.0 - 0.5 K/uL Final   Basophils Relative 04/20/2022  1  % Final   Basophils Absolute 04/20/2022 0.1   0.0 - 0.1 K/uL Final   Immature Granulocytes 04/20/2022 0  % Final   Abs Immature Granulocytes 04/20/2022 0.04  0.00 - 0.07 K/uL Final   Performed at [facility], [place] [address], [place], [place] [address]    RADIOGRAPHIC STUDIES: I have personally reviewed the radiological images as listed and agree with the findings in the report  No results found.  ASSESSMENT/PLAN   Possible causes of granulocytosis in this patient Acute infections Bacterial Various pyogenic cocci, Escherichia coli, Pseudomonas aeruginosa, Corynebacterium diphtheriae, Francisella tularensis   Spirochaetal Syphilis, Leptospirosis   Rickettsial [place] spotted fever   Chlamydial psittacosis   Protozoal Pneumocystis carinii infection   Mycotic actinomycosis, coccidioidomycosis  Acute Inflammation not caused by infection  RA, rheumatic fever, vasculitis, myositis, pancreatitis, hypersensitivity reactions.  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  She is trying to cut down but this is difficult.  Vapes are inexpensive, last 2 weeks and there is no indicator allowing the user to be aware of how much they are using.    Follow up:  Four months.  Will recheck CBC with diff on return.  Will also check TSH/FT4 and Cortisol level   Cancer Staging  No matching staging information was found for the patient.   No problem-specific Assessment & Plan notes found for this encounter.   No orders of the defined types were placed in this encounter.   All questions were answered. The patient knows to call the clinic with any problems, questions or concerns.  This note was  electronically signed.    Barbee Cough, MD  05/18/2022 4:33 PM

## 2022-05-18 ENCOUNTER — Encounter: Payer: Self-pay | Admitting: Oncology

## 2022-05-18 ENCOUNTER — Inpatient Hospital Stay (INDEPENDENT_AMBULATORY_CARE_PROVIDER_SITE_OTHER): Payer: Medicaid Other | Admitting: Oncology

## 2022-05-18 VITALS — BP 104/71 | HR 81 | Temp 97.4°F | Resp 14 | Ht 61.9 in | Wt 154.9 lb

## 2022-05-18 DIAGNOSIS — D72828 Other elevated white blood cell count: Secondary | ICD-10-CM

## 2022-05-18 DIAGNOSIS — Z716 Tobacco abuse counseling: Secondary | ICD-10-CM | POA: Insufficient documentation

## 2022-08-24 ENCOUNTER — Inpatient Hospital Stay: Payer: Medicaid Other

## 2022-08-24 ENCOUNTER — Inpatient Hospital Stay: Payer: Medicaid Other | Attending: Oncology | Admitting: Oncology

## 2022-08-24 VITALS — BP 116/77 | HR 83 | Temp 98.0°F | Resp 16 | Ht 61.9 in | Wt 158.9 lb

## 2022-08-24 DIAGNOSIS — F1290 Cannabis use, unspecified, uncomplicated: Secondary | ICD-10-CM | POA: Diagnosis not present

## 2022-08-24 DIAGNOSIS — E039 Hypothyroidism, unspecified: Secondary | ICD-10-CM | POA: Diagnosis not present

## 2022-08-24 DIAGNOSIS — Z716 Tobacco abuse counseling: Secondary | ICD-10-CM | POA: Diagnosis not present

## 2022-08-24 DIAGNOSIS — U070 Vaping-related disorder: Secondary | ICD-10-CM | POA: Diagnosis not present

## 2022-08-24 DIAGNOSIS — D72828 Other elevated white blood cell count: Secondary | ICD-10-CM

## 2022-08-24 DIAGNOSIS — D709 Neutropenia, unspecified: Secondary | ICD-10-CM | POA: Insufficient documentation

## 2022-08-24 LAB — CBC WITH DIFFERENTIAL (CANCER CENTER ONLY)
Abs Immature Granulocytes: 0.04 10*3/uL (ref 0.00–0.07)
Basophils Absolute: 0.1 10*3/uL (ref 0.0–0.1)
Basophils Relative: 1 %
Eosinophils Absolute: 0.1 10*3/uL (ref 0.0–0.5)
Eosinophils Relative: 1 %
HCT: 40.9 % (ref 36.0–46.0)
Hemoglobin: 13.3 g/dL (ref 12.0–15.0)
Immature Granulocytes: 0 %
Lymphocytes Relative: 34 %
Lymphs Abs: 4.5 10*3/uL — ABNORMAL HIGH (ref 0.7–4.0)
MCH: 30.4 pg (ref 26.0–34.0)
MCHC: 32.5 g/dL (ref 30.0–36.0)
MCV: 93.6 fL (ref 80.0–100.0)
Monocytes Absolute: 0.9 10*3/uL (ref 0.1–1.0)
Monocytes Relative: 7 %
Neutro Abs: 7.6 10*3/uL (ref 1.7–7.7)
Neutrophils Relative %: 57 %
Platelet Count: 404 10*3/uL — ABNORMAL HIGH (ref 150–400)
RBC: 4.37 MIL/uL (ref 3.87–5.11)
RDW: 13.2 % (ref 11.5–15.5)
WBC Count: 13.2 10*3/uL — ABNORMAL HIGH (ref 4.0–10.5)
nRBC: 0 % (ref 0.0–0.2)

## 2022-08-24 LAB — CORTISOL: Cortisol, Plasma: 5.5 ug/dL

## 2022-08-24 LAB — TSH: TSH: 1.463 u[IU]/mL (ref 0.350–4.500)

## 2022-08-24 LAB — T4, FREE: Free T4: 1.01 ng/dL (ref 0.61–1.12)

## 2022-08-24 NOTE — Progress Notes (Signed)
[facility] Follow up Visit:  Patient Care Team: Rhea Bleacher, NP as PCP - General  CHIEF COMPLAINTS/PURPOSE OF CONSULTATION:   HISTORY OF PRESENTING ILLNESS: Stacie Schmidt 27 y.o. 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  Patient has chronic fatigue and snores a lot in her sleep  Dec 12, 2021: WBC 10.0 hemoglobin 11.7 platelet count 401; 46 segs 44 lymphs 7 monos 2 eos 1 basophil  April 08, 2022 WBC 13.6 hemoglobin 11.5 MCV 79 platelet count 464; 47 segs 43 lymphs 8 monos 1 EO 1 basophil.  Absolute neutrophil count 6.4 absolute lymphocyte count 5.9.  Reticulocyte count 0.83% Vitamin B12 756 folate 8.1 ferritin 2.5 CMP normal  hemoglobin A1c 5.5 TSH 4.49  April 20 2022:  [facility] Consult  Feels well. Had a bout of laryngitis a few months ago treated with Abx.  No recurrent sinopulmonary infections.    Social:  Educational psychologist.  Divorced.  Vapes daily which she began after quitting smoking 2 yrs ago.  Began smoking cigarettes age 61 and quit at 56 and smoked 2 ppd.  EtOH once every 2 months  [facility] Mother alive 30 HTN and thyroid Father died 76 suicide 67 brother alive 49 well   WBC 12.0 hemoglobin 11.3 platelet count 430; 52 segs 39 lymphs 6 monos 2 eos 1 basophil JAK2 NGS panel negative FISH for BCR able normal  May 18 2022:   Reviewed results of labs with patient.  She is still vaping but has cut down a bit At next visit discuss her use of oral iron  August 24 2022:  Scheduled follow up regarding granulocytosis.  Still vaping.  Concerned about the prospect of quitting because she uses it to help cope with anxiety.  Not smoking marijuana as much.  Takes oral iron daily.  Has heavy periods which occur monthly.  No bleeding between periods.  Has copper IUD in place.   Has gained 4 lbs.  ECOG 1   Review of Systems  Constitutional:   Negative for appetite change, chills, fatigue, fever and unexpected weight change.  HENT:   Negative for mouth sores, nosebleeds, sore throat, trouble swallowing and voice change.   Eyes:  Negative for eye problems and icterus.       Vision changes:  None  Respiratory:  Negative for chest tightness, cough, hemoptysis, shortness of breath and wheezing.        PND:  none Orthopnea:  none DOE:    Cardiovascular:  Negative for chest pain, leg swelling and palpitations.       PND:  none Orthopnea:  none  Gastrointestinal:  Negative for abdominal pain, blood in stool, constipation, diarrhea and nausea.  Endocrine:       Cold intolerance:  none Heat intolerance:  none  Genitourinary:  Negative for bladder incontinence, difficulty urinating, dysuria, frequency, hematuria and nocturia.   Musculoskeletal:  Negative for arthralgias, back pain, gait problem, myalgias, neck pain and neck stiffness.  Skin:  Negative for itching, rash and wound.  Neurological:  Positive for headaches. Negative for dizziness, extremity weakness, gait problem, light-headedness, numbness, seizures and speech difficulty.       Has HA's quite often  Hematological:  Negative for adenopathy. Bruises/bleeds easily.    MEDICAL HISTORY: Past Medical History:  Diagnosis Date   Anxiety    Bipolar disorder ([place])    Depression    Hypothyroidism     SURGICAL HISTORY:  Past Surgical History:  Procedure Laterality Date   INDUCED ABORTION      SOCIAL HISTORY: Social History   Socioeconomic History   Marital status: Divorced    Spouse name: Not on file   Number of children: 2   Years of education: 9   Highest education level: 9th grade  Occupational History   Not on file  Tobacco Use   Smoking status: Former   Smokeless tobacco: Never  Scientific laboratory technician Use: Every day  Substance and Sexual Activity   Alcohol use: Yes   Drug use: Yes    Types: Marijuana   Sexual activity: Not on file  Other Topics Concern    Not on file  Social History Narrative   Not on file   Social Determinants of Health   Financial Resource Strain: Not on file  Food Insecurity: Not on file  Transportation Needs: Not on file  Physical Activity: Not on file  Stress: Not on file  Social Connections: Not on file  Intimate Partner Violence: Not on file    FAMILY HISTORY Family History  Problem Relation Age of Onset   Thyroid disease Mother    Hypertension Mother    Breast cancer Other    Liver cancer Other     ALLERGIES:  has No Known Allergies.  MEDICATIONS:  Current Outpatient Medications  Medication Sig Dispense Refill   buPROPion (ZYBAN) 150 MG 12 hr tablet Take 150 mg by mouth in the morning.     busPIRone (BUSPAR) 10 MG tablet Take by mouth.     escitalopram (LEXAPRO) 5 MG tablet Take 1 tablet by mouth daily.     ferrous sulfate 325 (65 FE) MG tablet Take 325 mg by mouth 2 (two) times daily with a meal.     levothyroxine (SYNTHROID) 112 MCG tablet TAKE 1 TABLET BY MOUTH ONCE DAILY IN THE MORNING BEFORE BREAKFAST     lurasidone (LATUDA) 40 MG TABS tablet TAKE 1 TABLET BY MOUTH ONCE DAILY WITH MINIMUM 350KCAL FOOD     paragard intrauterine copper IUD IUD by Intrauterine route.     trimethoprim-polymyxin b (POLYTRIM) ophthalmic solution 1 drop 4 (four) times daily.     No current facility-administered medications for this visit.    PHYSICAL EXAMINATION:  ECOG PERFORMANCE STATUS: 0 - Asymptomatic   Vitals:   08/24/22 1556  BP: 116/77  Pulse: 83  Resp: 16  Temp: 98 F (36.7 C)  SpO2: 98%    Filed Weights   08/24/22 1556  Weight: 158 lb 14.4 oz (72.1 kg)     Physical Exam Vitals and nursing note reviewed.  Constitutional:      Appearance: Normal appearance. She is normal weight. She is not toxic-appearing or diaphoretic.  HENT:     Head: Normocephalic and atraumatic.     Right Ear: External ear normal.     Left Ear: External ear normal.     Nose: Nose normal. No congestion or  rhinorrhea.  Eyes:     General: No scleral icterus.    Extraocular Movements: Extraocular movements intact.     Conjunctiva/sclera: Conjunctivae normal.     Pupils: Pupils are equal, round, and reactive to light.  Cardiovascular:     Rate and Rhythm: Normal rate.     Heart sounds: No murmur heard.    No friction rub. No gallop.  Pulmonary:     Effort: Pulmonary effort is normal. No respiratory distress.     Breath sounds: Normal breath sounds. No  stridor. No wheezing, rhonchi or rales.  Chest:     Chest wall: No tenderness.  Abdominal:     General: Bowel sounds are normal. There is no distension.     Palpations: Abdomen is soft. There is no mass.     Tenderness: There is no abdominal tenderness. There is no guarding or rebound.     Hernia: No hernia is present.  Musculoskeletal:        General: No swelling, tenderness or deformity.     Cervical back: Normal range of motion and neck supple. No rigidity or tenderness.  Lymphadenopathy:     Head:     Right side of head: No submental, submandibular, tonsillar, preauricular, posterior auricular or occipital adenopathy.     Left side of head: No submental, submandibular, tonsillar, preauricular, posterior auricular or occipital adenopathy.     Cervical: No cervical adenopathy.     Right cervical: No superficial, deep or posterior cervical adenopathy.    Left cervical: No superficial, deep or posterior cervical adenopathy.     Upper Body:     Right upper body: No supraclavicular, axillary, pectoral or epitrochlear adenopathy.     Left upper body: No supraclavicular, axillary, pectoral or epitrochlear adenopathy.  Skin:    General: Skin is warm.     Coloration: Skin is not jaundiced.     Findings: No erythema, lesion or rash.  Neurological:     General: No focal deficit present.     Mental Status: She is alert and oriented to person, place, and time. Mental status is at baseline.     Cranial Nerves: No cranial nerve deficit.   Psychiatric:        Mood and Affect: Mood normal.        Behavior: Behavior normal.        Thought Content: Thought content normal.        Judgment: Judgment normal.      LABORATORY DATA: I have personally reviewed the data as listed:  No visits with results within 1 Month(s) from this visit.  Latest known visit with results is:  Orders Only on 04/21/2022  Component Date Value Ref Range Status   [facility] test code 04/21/2022 [id number]   Final   [facility] test name 04/21/2022 [id number]   Final   Source ([facility]) 04/21/2022 BLOOD   Final   Performed at [facility], [place] [address], [place], [place] [address]   Misc [facility] result 04/21/2022 COMMENT   Final   Comment: (NOTE) Test Ordered: [phone] Oncology BCR/ABL1 FISH Specimen Type                  BLOOD                     YU   Cells Counted                  200                       YU   Cells Analyzed                 200                       YU   FISH Result                    Comment:  YU   NORMAL:  NO BCR OR ABL1 GENE REARRANGEMENT OBSERVED Interpretation                 Comment:                  YU               nuc ish 9q34(ASS1,ABL1)x2,22q11.2(BCRx2)[200].      The fluorescence in situ hybridization (FISH) study was normal. FISH, using unique sequence DNA probes for the ABL1 and BCR gene regions showed two ABL1 signals (red), two control ASS1 gene signals (aqua) located adjacent to the ABL1 locus at 9q34, and two BCR signals (green) at 22q11.2 in all interphase nuclei examined. There was NO evidence of CML or ALL-associated BCR::ABL1 dual fusion signals in this analysis. .      This analysis is limited to abnormalities detectable by the specific probes incl                          uded in the study. FISH results should be interpreted within the context of a full cytogenetic analysis and pathology evaluation.  A BCR::ABL1 gene fusion in greater than 3 interphase nuclei in a patient with a  new clinical diagnosis is considered positive. The DNA probe vendor for this study was [employer], research (physical sciences)). .      This test was developed and its performance characteristics determined by [facility]). It has not been cleared or approved by the U.S. Food and Drug Administration. Director Review:               Comment:                  YU   [place], PHD, [facility] Performed At: [facility] [place], [place] [id number] Rush Farmer MD [id number] Performed At: [facility] [address] [place], [place] [id number] Katina Degree MDPhD [id number]    [facility] test code 04/21/2022 [id number]   Corrected   CORRECTED ON 10/09 AT 1056: PREVIOUSLY REPORTED AS [id number]   [facility] test name 04/21/2022 JAK2 [id number] MPL   Corrected   CORRECTED ON 10/09 AT 1056: PREVIOUSLY REPORTED AS [id number]   Misc [facility] result 04/21/2022 COMMENT   Final   Comment: See Scanned report in [place] (NOTE) See Brother-1 Performed At: [facility] [place], [place] [id number] Rush Farmer MD [id number] Performed at [facility], [place] [address], [place],  [address]     RADIOGRAPHIC STUDIES: I have personally reviewed the radiological images as listed and agree with the findings in the report  No results found.  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  Copper IUD's on their own are not generally associated with development of systemic granulocytosis   Vaping:  Discussed importance of quitting with patient.  She is trying to cut down but this is difficult.  Vapes are inexpensive, last 2 weeks and there is no indicator allowing the user to be aware of how much they are using.    Marijuana use:     August 24 2022:  Patient states she is decreasing use    Cancer Staging  No matching staging information was found for the  patient.   No problem-specific Assessment & Plan notes found for this encounter.   No orders of the defined types were placed  in this encounter.  22  minutes was spent in patient care.  This included time spent preparing to see the patient (e.g., review of tests), obtaining and/or reviewing separately obtained history, counseling and educating the patient, ordering tests; documenting clinical information in the electronic or other health record, independently interpreting results and communicating results to the patient as well as coordination of care.      All questions were answered. The patient knows to call the clinic with any problems, questions or concerns.  This note was electronically signed.    Barbee Cough, MD  08/24/2022 4:05 PM

## 2022-08-31 DIAGNOSIS — F1290 Cannabis use, unspecified, uncomplicated: Secondary | ICD-10-CM | POA: Insufficient documentation

## 2023-02-22 ENCOUNTER — Inpatient Hospital Stay: Payer: Medicaid Other

## 2023-02-22 ENCOUNTER — Inpatient Hospital Stay: Payer: Medicaid Other | Attending: Oncology | Admitting: Oncology

## 2023-11-25 ENCOUNTER — Ambulatory Visit: Admitting: Physician Assistant
